# Patient Record
Sex: Female | Born: 1945 | Race: White | Hispanic: No | Marital: Married | Smoking: Never smoker
Health system: Southern US, Community
[De-identification: ages and names within clinical notes are randomized; demographics above are authoritative.]

## PROBLEM LIST (undated history)

## (undated) DIAGNOSIS — I10 Essential (primary) hypertension: Secondary | ICD-10-CM

## (undated) HISTORY — PX: SHOULDER SURGERY: SHX246

## (undated) HISTORY — PX: FOOT SURGERY: SHX648

---

## 2015-08-07 ENCOUNTER — Ambulatory Visit (HOSPITAL_COMMUNITY)
Admission: EM | Admit: 2015-08-07 | Discharge: 2015-08-07 | Disposition: A | Discharge status: Home / Self Care | Payer: Medicare Other | Attending: Physician Assistant | Admitting: Physician Assistant

## 2015-08-07 ENCOUNTER — Encounter (HOSPITAL_COMMUNITY): Payer: Self-pay | Admitting: Emergency Medicine

## 2015-08-07 ENCOUNTER — Ambulatory Visit (INDEPENDENT_AMBULATORY_CARE_PROVIDER_SITE_OTHER): Payer: Medicare Other

## 2015-08-07 DIAGNOSIS — S0083XA Contusion of other part of head, initial encounter: Secondary | ICD-10-CM

## 2015-08-07 HISTORY — DX: Essential (primary) hypertension: I10

## 2015-08-07 NOTE — ED Notes (Signed)
[facility] a complaint of an injury to the right side of her face and jaw secondary to a fall the occurred 5 days prior. The patient did have significant edema and bruising to her face.

## 2015-08-07 NOTE — Discharge Instructions (Signed)

## 2015-08-07 NOTE — ED Provider Notes (Signed)
CSN: [id number]     Arrival date & time 08/07/15  1825 History   None    Chief Complaint  Patient presents with  . Fall  . Facial Injury   (Consider location/radiation/quality/duration/timing/severity/associated sxs/prior Treatment) HPI History obtained from patient: Location: Right face  Context/Duration: Early Tuesday morning patient was using the bathroom and tripped and fell into the door jam injuring her right face. She states that she was able to make it back to bed without any problem. She had no loss of consciousness. She states that shortly after that swelling and some bruising. She states that the only reason she is really here today is because her son has been worried that she needs to be seen for possible head injury and facial fractures. Patient states that she did see the dentist on Wednesday but due to the pain in her right TMJ they were not able to see her. No x-rays were done at that time.   Severity: 1  Quality: Slight ache Timing: Constant            Home Treatment: Cold compresses Associated symptoms:  None Family History: No history of cancer, diabetes Social History: Nonsmoker  Past Medical History  Diagnosis Date  . Hypertension    Past Surgical History  Procedure Laterality Date  . Shoulder surgery    . Foot surgery     History reviewed. No pertinent family history. Social History  Substance Use Topics  . Smoking status: Never Smoker   . Smokeless tobacco: None  . Alcohol Use: No   OB History    No data available     Review of Systems ROS +'ve facial bruising and pain  Denies: HEADACHE, NAUSEA, ABDOMINAL PAIN, CHEST PAIN, CONGESTION, DYSURIA, SHORTNESS OF BREATH  Allergies  Review of patient's allergies indicates no known allergies.  Home Medications   Prior to Admission medications   Medication Sig Start Date End Date Taking? Authorizing Provider  hydrochlorothiazide (HYDRODIURIL) 25 MG tablet Take 25 mg by mouth daily.   Yes Historical  Provider, MD  simvastatin (ZOCOR) 40 MG tablet Take 40 mg by mouth daily.   Yes Historical Provider, MD  tolterodine (DETROL LA) 4 MG 24 hr capsule Take 4 mg by mouth daily.   Yes Historical Provider, MD   Meds Ordered and Administered this Visit  Medications - No data to display  BP 156/62 mmHg  Pulse 77  Temp(Src) 99 F (37.2 C) (Oral)  Resp 18  SpO2 96% No data found.   Physical Exam  HENT:  Head:     NURSES NOTES AND VITAL SIGNS REVIEWED. CONSTITUTIONAL: Well developed, well nourished, no acute distress HEENT: normocephalic, atraumatic EYES: Conjunctiva normal, Normal ocular motion in all fields. NECK:normal ROM, supple, no adenopathy PULMONARY:No respiratory distress, normal effort ABDOMINAL: Soft, ND, NT BS+, No CVAT MUSCULOSKELETAL: Normal ROM of all extremities,  SKIN: warm and dry without rash PSYCHIATRIC: Mood and affect, behavior are normal  ED Course  Procedures (including critical care time)  Labs Review Labs Reviewed - No data to display  Imaging Review Dg Orbits  08/07/2015  CLINICAL DATA:  Recent fall with right facial swelling, initial encounter EXAM: ORBITS - COMPLETE 4+ VIEW COMPARISON:  None. FINDINGS: There is no evidence of fracture or other significant bone abnormality. No orbital emphysema or sinus air-fluid levels are seen. IMPRESSION: No acute abnormality noted. Electronically Signed   By: Alcide Clever M.D.   On: 08/07/2015 19:44    I HAVE PERSONALLY  REVIEWED AND  DISCUSSED RESULTS OF  X-RAYS WITH PATIENT  PRIOR TO DISCHARGE.    Visual Acuity Review  Right Eye Distance:   Left Eye Distance:   Bilateral Distance:    Right Eye Near:   Left Eye Near:    Bilateral Near:      .   MDM   1. Contusion of face, initial encounter   Total Visit Time:20 MINUTES "GREATER THAN 50% WAS SPENT IN COUNSELING AND COORDINATION OF CARE WITH THE PATIENT" DISCUSSION OF TREATMENT OF FACIAL INJURIES, AND PROGNOSIS FOR FULL RECOVERY.  Patient is  reassured that there are no issues that require transfer to higher level of care at this time or additional tests. Patient is advised to continue home symptomatic treatment. Patient is advised that if there are new or worsening symptoms to attend the emergency department, contact primary care provider, or return to [facility]. Instructions of care provided discharged home in stable condition.    THIS NOTE WAS GENERATED USING A VOICE RECOGNITION SOFTWARE PROGRAM. ALL REASONABLE EFFORTS  WERE MADE TO PROOFREAD THIS DOCUMENT FOR ACCURACY.  I have verbally reviewed the discharge instructions with the patient. A printed AVS was given to the patient.  All questions were answered prior to discharge.      Tharon AquasFrank C Aryona Sill, PA 08/07/15 2016

## 2017-12-31 IMAGING — DX DG ORBITS COMPLETE 4+V
3 series · 3 of 3 positions shown · non-contrast
Comparison: None.

CLINICAL DATA: Recent fall with right facial swelling, initial
encounter

EXAM:
ORBITS - COMPLETE 4+ VIEW

[orbital axial]
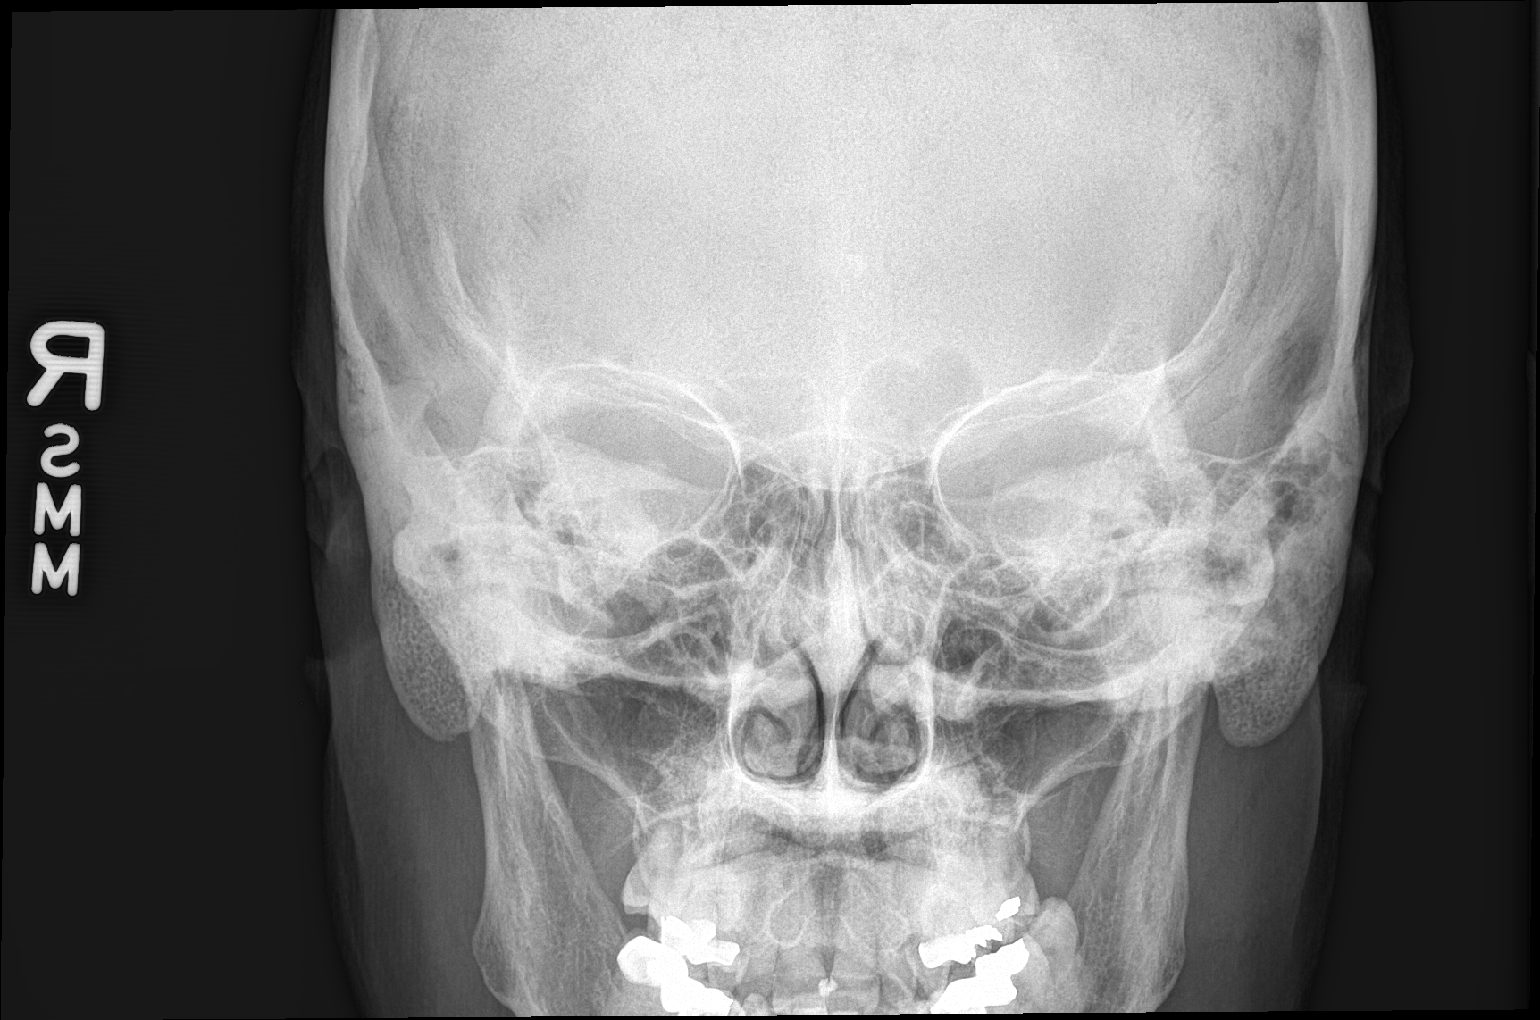

[skull waters]
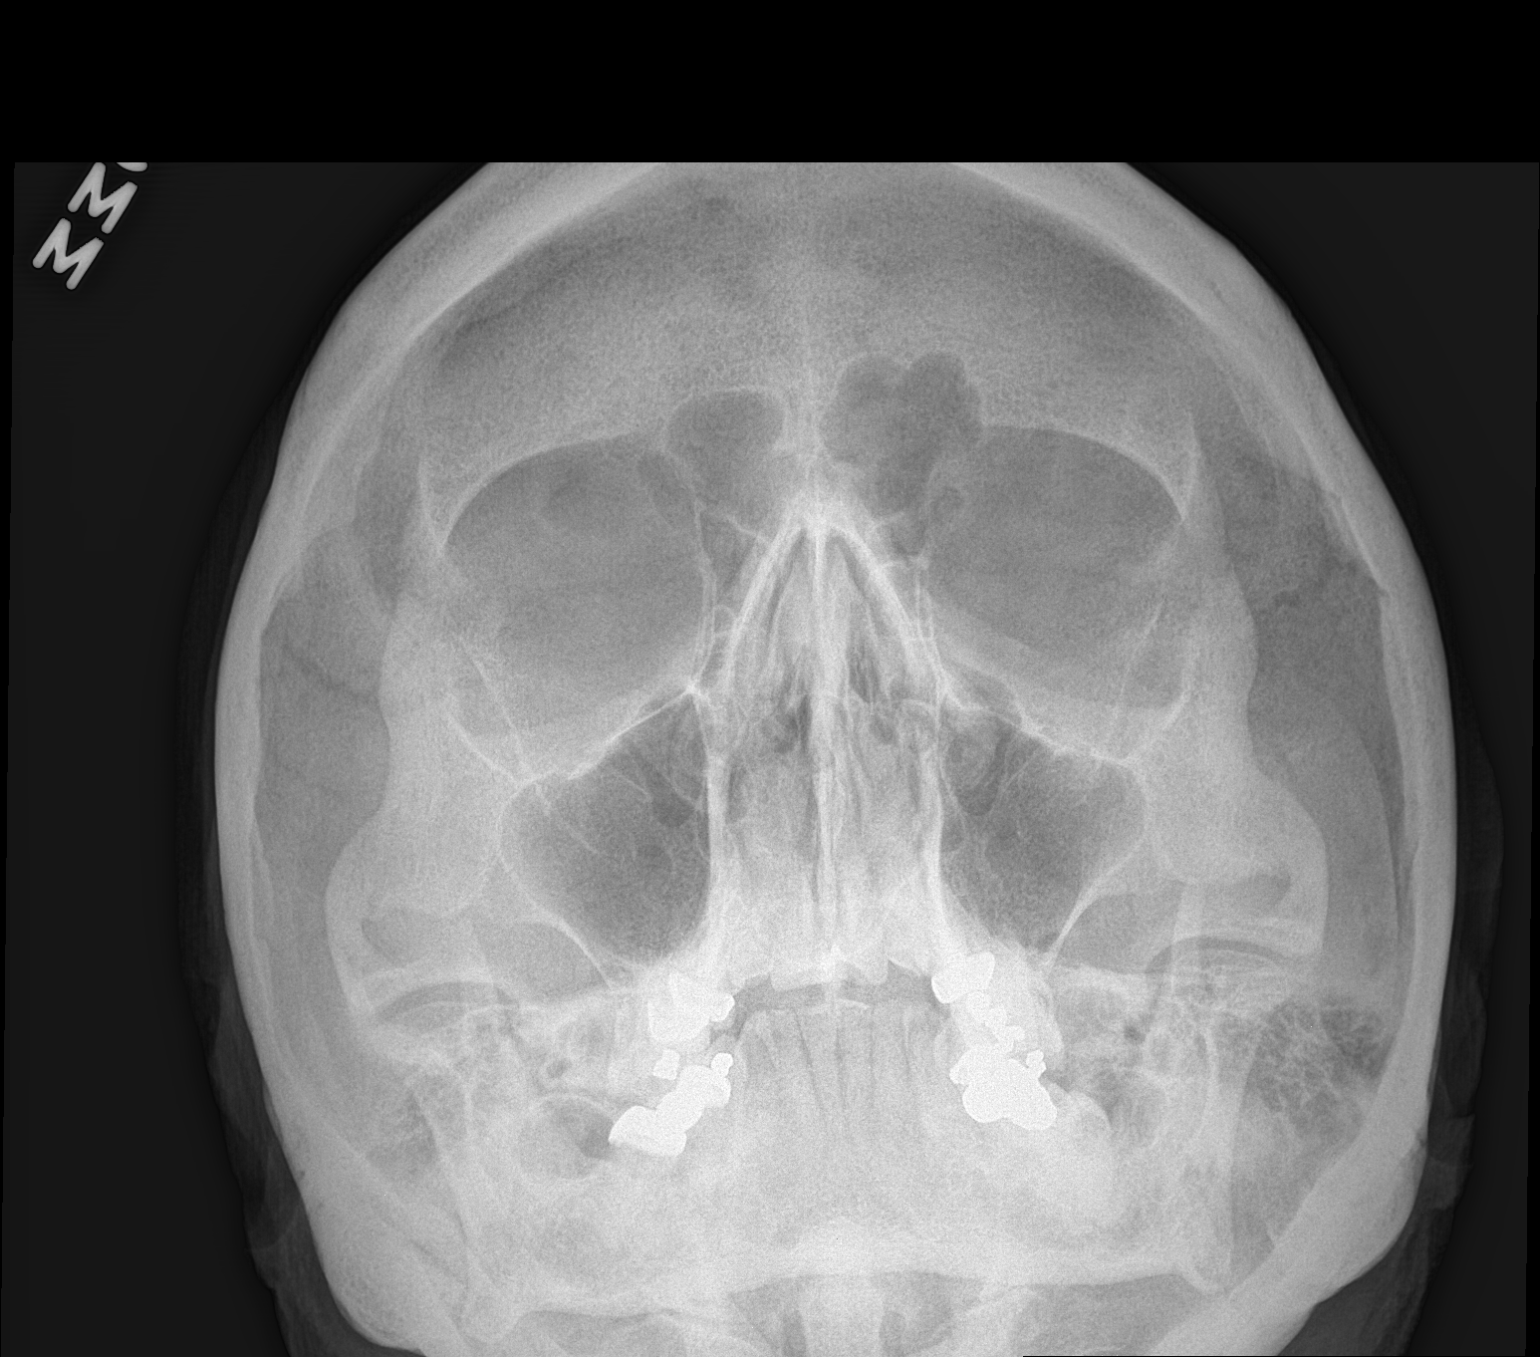

[skull lat]
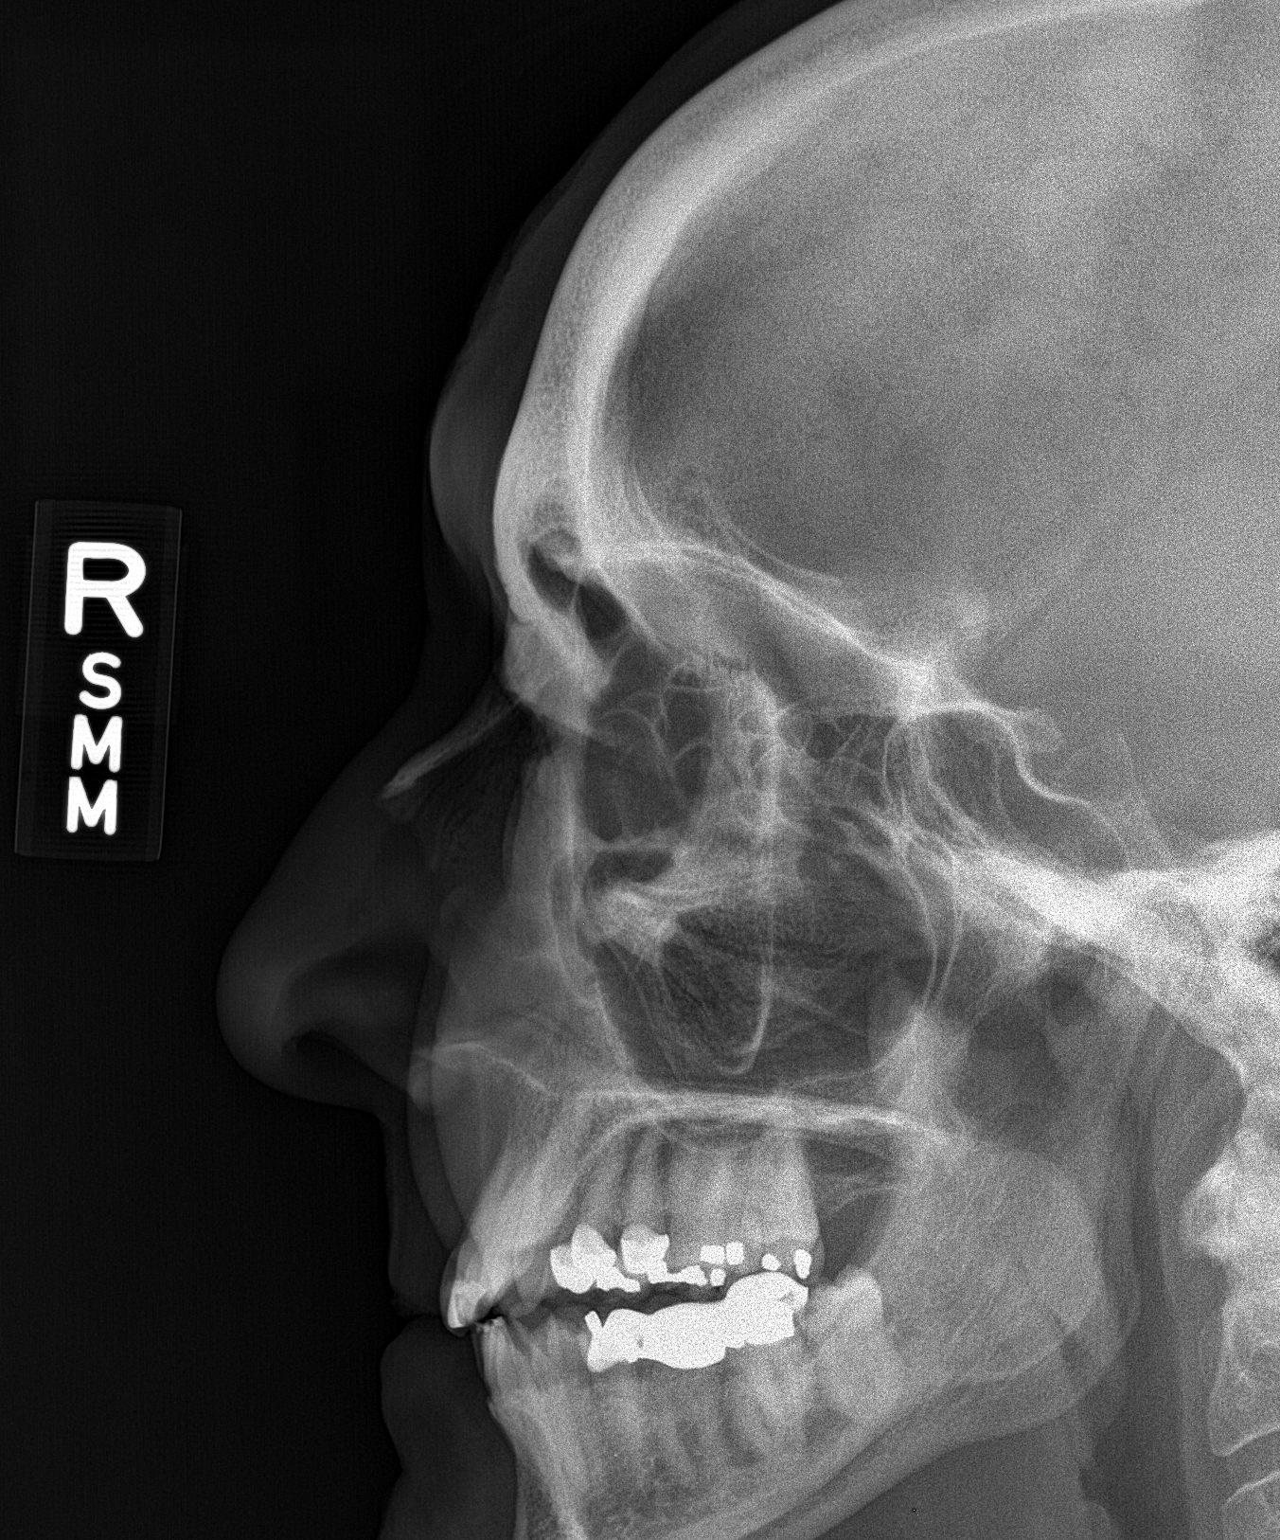

[3 of 3 positions shown; findings below may reference images not displayed]

FINDINGS: There is no evidence of fracture or other significant bone
abnormality. No orbital emphysema or sinus air-fluid levels are
seen.
IMPRESSION: No acute abnormality noted.

## 2022-08-25 DEATH — deceased
